# Patient Record
Sex: Female | Born: 1975 | Hispanic: Yes | Smoking: Never smoker
Health system: Southern US, Community
[De-identification: ages and names within clinical notes are randomized; demographics above are authoritative.]

## PROBLEM LIST (undated history)

## (undated) DIAGNOSIS — I10 Essential (primary) hypertension: Secondary | ICD-10-CM

---

## 2020-05-28 ENCOUNTER — Emergency Department
Admission: EM | Admit: 2020-05-28 | Discharge: 2020-05-28 | Disposition: A | Discharge status: Home / Self Care | Payer: 59 | Attending: Emergency Medicine | Admitting: Emergency Medicine

## 2020-05-28 ENCOUNTER — Emergency Department: Payer: 59

## 2020-05-28 ENCOUNTER — Other Ambulatory Visit: Payer: Self-pay

## 2020-05-28 DIAGNOSIS — R110 Nausea: Secondary | ICD-10-CM | POA: Insufficient documentation

## 2020-05-28 DIAGNOSIS — R109 Unspecified abdominal pain: Secondary | ICD-10-CM | POA: Insufficient documentation

## 2020-05-28 DIAGNOSIS — R630 Anorexia: Secondary | ICD-10-CM | POA: Insufficient documentation

## 2020-05-28 DIAGNOSIS — M549 Dorsalgia, unspecified: Secondary | ICD-10-CM | POA: Insufficient documentation

## 2020-05-28 DIAGNOSIS — I10 Essential (primary) hypertension: Secondary | ICD-10-CM | POA: Insufficient documentation

## 2020-05-28 HISTORY — DX: Essential (primary) hypertension: I10

## 2020-05-28 LAB — BASIC METABOLIC PANEL
Anion gap: 9 (ref 5–15)
BUN: 13 mg/dL (ref 6–20)
CO2: 25 mmol/L (ref 22–32)
Calcium: 8.7 mg/dL — ABNORMAL LOW (ref 8.9–10.3)
Chloride: 105 mmol/L (ref 98–111)
Creatinine, Ser: 1.23 mg/dL — ABNORMAL HIGH (ref 0.44–1.00)
GFR, Estimated: 56 mL/min — ABNORMAL LOW (ref >60)
Glucose, Bld: 113 mg/dL — ABNORMAL HIGH (ref 70–99)
Potassium: 3.7 mmol/L (ref 3.5–5.1)
Sodium: 139 mmol/L (ref 135–145)

## 2020-05-28 LAB — CBC
HCT: 33.1 % — ABNORMAL LOW (ref 36.0–46.0)
Hemoglobin: 9.7 g/dL — ABNORMAL LOW (ref 12.0–15.0)
MCH: 21.2 pg — ABNORMAL LOW (ref 26.0–34.0)
MCHC: 29.3 g/dL — ABNORMAL LOW (ref 30.0–36.0)
MCV: 72.3 fL — ABNORMAL LOW (ref 80.0–100.0)
Platelets: 576 10*3/uL — ABNORMAL HIGH (ref 150–400)
RBC: 4.58 MIL/uL (ref 3.87–5.11)
RDW: 18 % — ABNORMAL HIGH (ref 11.5–15.5)
WBC: 7.4 10*3/uL (ref 4.0–10.5)
nRBC: 0 % (ref 0.0–0.2)

## 2020-05-28 LAB — HEPATIC FUNCTION PANEL
ALT: 15 U/L (ref 0–44)
AST: 15 U/L (ref 15–41)
Albumin: 3.9 g/dL (ref 3.5–5.0)
Alkaline Phosphatase: 63 U/L (ref 38–126)
Bilirubin, Direct: <0.1 mg/dL (ref 0.0–0.2)
Total Bilirubin: 0.5 mg/dL (ref 0.3–1.2)
Total Protein: 7.5 g/dL (ref 6.5–8.1)

## 2020-05-28 LAB — URINALYSIS, ROUTINE W REFLEX MICROSCOPIC
Bacteria, UA: NONE SEEN
Bilirubin Urine: NEGATIVE
Glucose, UA: NEGATIVE mg/dL
Ketones, ur: NEGATIVE mg/dL
Leukocytes,Ua: NEGATIVE
Nitrite: NEGATIVE
Protein, ur: NEGATIVE mg/dL
Specific Gravity, Urine: 1.013 (ref 1.005–1.030)
pH: 6 (ref 5.0–8.0)

## 2020-05-28 LAB — LIPASE, BLOOD: Lipase: 43 U/L (ref 11–51)

## 2020-05-28 LAB — PREGNANCY, URINE: Preg Test, Ur: NEGATIVE

## 2020-05-28 MED ORDER — LACTATED RINGERS IV BOLUS
1000.0000 mL | Freq: Once | INTRAVENOUS | Status: AC
  Administered 2020-05-28: 1000 mL via INTRAVENOUS

## 2020-05-28 MED ORDER — OXYCODONE-ACETAMINOPHEN 5-325 MG PO TABS: 2.0000 | ORAL_TABLET | Freq: Four times a day (QID) | ORAL | 0 refills | Status: DC | PRN

## 2020-05-28 MED ORDER — HYDROCODONE-ACETAMINOPHEN 5-325 MG PO TABS
2.0000 | ORAL_TABLET | Freq: Once | ORAL | Status: AC
Start: 2020-05-28 — End: 2020-05-28
  Administered 2020-05-28: 2 via ORAL
  Filled 2020-05-28: qty 2

## 2020-05-28 NOTE — Discharge Instructions (Addendum)
Your workup in the Emergency Department today was reassuring.  We did not find any specific abnormalities.  We recommend you drink plenty of fluids, take your regular medications and/or any new ones prescribed today, and follow up with the doctor(s) listed in these documents as recommended.  Return to the Emergency Department if you develop new or worsening symptoms that concern you.  

## 2020-05-28 NOTE — ED Triage Notes (Addendum)
Pt presents to ER c/o BIL flank pain that radiates into groin that started yesterday morning.  Pt states she has hx of kidney stones.  Pt denies urinary symptoms.  Pt reports nausea, but denied vomiting or diarrhea.

## 2020-05-28 NOTE — ED Provider Notes (Signed)
[facility] Emergency Department Provider Note  ____________________________________________   Event Date/Time   First MD Initiated Contact with Patient 05/28/20 [phone]     (approximate)  I have reviewed the triage vital signs and the nursing notes.   HISTORY  Chief Complaint Back Pain    HPI Christina Beard is a 45 y.o. female with medical history as listed below who presents for evaluation of about 24 hours of sharp and aching pain in both sides of her back that radiates around to the front of her abdomen.  She noticed that when she woke up yesterday morning and it is persistent.  Nothing particular makes it better or worse.  She has had nausea and decreased appetite but no vomiting and no diarrhea.  No pain in her pelvis.  She is currently on her menstrual cycle.  She has no pain when she urinates and no increased urinary frequency.  She denies  fever, sore throat, chest pain, shortness of breath.  She has no known history of gallbladder issues and at first she says she has a history of kidney stones but then she said she is not sure.          Past Medical History:  Diagnosis Date  . Hypertension     There are no problems to display for this patient.   History reviewed. No pertinent surgical history.  Prior to Admission medications   Medication Sig Start Date End Date Taking? Authorizing Provider  oxyCODONE-acetaminophen (PERCOCET) 5-325 MG tablet Take 2 tablets by mouth every 6 (six) hours as needed for severe pain. 05/28/20  Yes Loleta Rose, MD  ibuprofen (ADVIL) 800 MG tablet Take by mouth.    [provider]  meclizine (ANTIVERT) 25 MG tablet Take by mouth.    [provider]    Allergies Patient has no known allergies.  History reviewed. No pertinent family history.  Social History Social History   Tobacco Use  . Smoking status: Never Smoker  . Smokeless tobacco: Never Used  Substance Use Topics  . Alcohol  use: Yes    Comment: occasional  . Drug use: Never    Review of Systems Constitutional: No fever/chills Eyes: No visual changes. ENT: No sore throat. Cardiovascular: Denies chest pain. Respiratory: Denies shortness of breath. Gastrointestinal: Pain radiating either from or to both sides of her back/flank to the front of her abdomen.  Nausea, no vomiting, no diarrhea. Genitourinary: Negative for dysuria. Musculoskeletal: Bilateral flank pain. Integumentary: Negative for rash. Neurological: Negative for headaches, focal weakness or numbness.   ____________________________________________   PHYSICAL EXAM:  VITAL SIGNS: ED Triage Vitals  Enc Vitals Group     BP 05/28/20 0146 (!) 141/84     Pulse Rate 05/28/20 0146 77     Resp 05/28/20 0146 18     Temp 05/28/20 0146 97.7 F (36.5 C)     Temp Source 05/28/20 0146 Oral     SpO2 05/28/20 0146 99 %     Weight 05/28/20 0149 68.9 kg (152 lb)     Height 05/28/20 0149 1.626 m (5\' 4" )     Head Circumference --      Peak Flow --      Pain Score 05/28/20 0149 9     Pain Loc --      Pain Edu? --      Excl. in GC? --     Constitutional: Alert and oriented.  Eyes: Conjunctivae are normal.  Head: Atraumatic. Nose: No congestion/rhinnorhea.  Mouth/Throat: Patient is wearing a mask. Neck: No stridor.  No meningeal signs.   Cardiovascular: Normal rate, regular rhythm. Good peripheral circulation. Respiratory: Normal respiratory effort.  No retractions. Gastrointestinal: Soft and nondistended.  Tenderness to palpation of the epigastrium with increased tenderness to the right upper quadrant with an equivocal Murphy sign.  No lower abdominal tenderness to palpation. Musculoskeletal: Tenderness to percussion on bilateral flanks.  No lower extremity tenderness nor edema. No gross deformities of extremities. Neurologic:  Normal speech and language. No gross focal neurologic deficits are appreciated.  Skin:  Skin is warm, dry and  intact. Psychiatric: Mood and affect are normal. Speech and behavior are normal.  ____________________________________________   LABS (all labs ordered are listed, but only abnormal results are displayed)  Labs Reviewed  CBC - Abnormal; Notable for the following components:      Result Value   Hemoglobin 9.7 (*)    HCT 33.1 (*)    MCV 72.3 (*)    MCH 21.2 (*)    MCHC 29.3 (*)    RDW 18.0 (*)    Platelets 576 (*)    All other components within normal limits  BASIC METABOLIC PANEL - Abnormal; Notable for the following components:   Glucose, Bld 113 (*)    Creatinine, Ser 1.23 (*)    Calcium 8.7 (*)    GFR, Estimated 56 (*)    All other components within normal limits  URINALYSIS, ROUTINE W REFLEX MICROSCOPIC - Abnormal; Notable for the following components:   Color, Urine YELLOW (*)    APPearance CLEAR (*)    Hgb urine dipstick MODERATE (*)    All other components within normal limits  PREGNANCY, URINE  HEPATIC FUNCTION PANEL  LIPASE, BLOOD  POC URINE PREG, ED   ____________________________________________  EKG  None - EKG not ordered by ED physician ____________________________________________  RADIOLOGY Marylou MccoyI, Cory Forbach, personally viewed and evaluated these images (plain radiographs) as part of my medical decision making, as well as reviewing the written report by the radiologist.  ED MD interpretation: No acute abnormality identified on US renal nor US RUQ.  Official radiology report(s): US Renal  Result Date: 05/28/2020 CLINICAL DATA:  Bilateral flank pain, evaluate for hydronephrosis and urolithiasis EXAM: RENAL / URINARY TRACT ULTRASOUND COMPLETE COMPARISON:  Contemporary right upper quadrant ultrasound FINDINGS: Right Kidney: Renal measurements: 10.2 x 4.8 x 4.9 cm = volume: 124 mL. Echogenicity is within normal limits. No concerning renal mass, shadowing calculus or hydronephrosis. Left Kidney: Renal measurements: 9.9 x 5.0 x 5.4 cm = volume: 138 mL.  Echogenicity is within normal limits. No concerning renal mass, shadowing calculus or hydronephrosis. Bladder: Appears normal for degree of bladder distention. Other: None. IMPRESSION: Unremarkable urinary tract ultrasound. Electronically Signed   By: Kreg ShropshirePrice  DeHay M.D.   On: 05/28/2020 05:11   US ABDOMEN LIMITED RUQ (LIVER/GB)  Result Date: 05/28/2020 CLINICAL DATA:  Bilateral flank pain EXAM: ULTRASOUND ABDOMEN LIMITED RIGHT UPPER QUADRANT COMPARISON:  None. FINDINGS: Gallbladder: Gallbladder is partially contracted at the time of examination. Wall thickness of 1.8 mm is remains within normal limits. No visible calcified gallstones or biliary sludge. No pericholecystic fluid. Sonographic Eulah PontMurphy sign is reportedly negative. Common bile duct: Diameter: 2.5 mm, nondilated Liver: Ill-defined echogenic focus seen in the left lobe liver (11/28) measuring approximately 2.1 cm in size. No other focal liver lesion. Normal hepatic echogenicity. No intrahepatic biliary ductal dilatation. Smooth liver surface contour. Portal vein is patent on color Doppler imaging with normal direction of blood flow  towards the liver. Other: None. IMPRESSION: 1. Gallbladder is partially contracted at the time of examination but otherwise appears within normal limits. 2. Ill-defined echogenic focus in the left lobe liver measuring 2.1 cm. This is nonspecific and could reflect a hemangioma or focal fatty infiltration in the absence of hepatic risk factors. If further evaluation is clinically warranted, contrast-enhanced MR imaging could be obtained on a nonemergent basis. Electronically Signed   By: Kreg Shropshire M.D.   On: 05/28/2020 05:13    ____________________________________________   PROCEDURES   Procedure(s) performed (including Critical Care):  Procedures   ____________________________________________   INITIAL IMPRESSION / MDM / ASSESSMENT AND PLAN / ED COURSE  As part of my medical decision making, I reviewed the  following data within the electronic MEDICAL RECORD NUMBER Nursing notes reviewed and incorporated, Labs reviewed , Old chart reviewed, Notes from prior ED visits and [place] Controlled Substance Database   Differential diagnosis includes, but is not limited to, biliary colic, UTI/pyelonephritis, renal/ureteral stone, ovarian cysts, STD/PID.  The patient's symptoms were acute in onset and have been persistent for nearly 24 hours.  The nature of the systems seems more consistent with biliary colic than renal colic, although the distribution of pain is not completely consistent with either.  However on exam she was quite tender in the epigastrium and right upper quadrant as well as to percussion on her flanks although the pain seems to be a little bit more inferior than one would typically expect from CVA tenderness.  I added on hepatic function test and lipase to her existing lab work.  Her basic metabolic panel is notable for a slight elevation of her creatinine to 1.2 but generally reassuring.  CBC is normal other than some mild anemia.  Urinalysis is only notable for hemoglobin but the patient is on her menstrual cycle.  Urine pregnancy test is negative.  We will evaluate with bilateral renal ultrasound to look for evidence of hydronephrosis suggestive of kidney stone, as well as a right upper quadrant ultrasound to evaluate her gallbladder.  The patient declines any pain medicine.  She said that she got something earlier but I do not see any record of it.  Regardless she does not need analgesia or antiemetics at this time.       Clinical Course as of 05/28/20 4098  Mon May 28, 2020  1191 Patient says she feels better but still feels some pain in her back and sides.  Her ultrasounds were unremarkable with no evidence of obstructive uropathy/hydronephrosis and no right upper quadrant abnormalities to suggest biliary colic.  We talked about the possibility of musculoskeletal strain.  Medications as listed  below, conservative management as an outpatient.  I gave my usual and customary return precautions and she agrees with the plan. [CF]    Clinical Course User Index [CF] Loleta Rose, MD     ____________________________________________  FINAL CLINICAL IMPRESSION(S) / ED DIAGNOSES  Final diagnoses:  Bilateral flank pain     MEDICATIONS GIVEN DURING THIS VISIT:  Medications  lactated ringers bolus 1,000 mL (0 mLs Intravenous Stopped 05/28/20 0606)  HYDROcodone-acetaminophen (NORCO/VICODIN) 5-325 MG per tablet 2 tablet (2 tablets Oral Given 05/28/20 0636)     ED Discharge Orders         Ordered    oxyCODONE-acetaminophen (PERCOCET) 5-325 MG tablet  Every 6 hours PRN        05/28/20 0654          *Please note:  Dorris Fetch was  evaluated in Emergency Department on 05/28/2020 for the symptoms described in the history of present illness. She was evaluated in the context of the global COVID-19 pandemic, which necessitated consideration that the patient might be at risk for infection with the SARS-CoV-2 virus that causes COVID-19. Institutional protocols and algorithms that pertain to the evaluation of patients at risk for COVID-19 are in a state of rapid change based on information released by regulatory bodies including the CDC and federal and state organizations. These policies and algorithms were followed during the patient's care in the ED.  Some ED evaluations and interventions may be delayed as a result of limited staffing during and after the pandemic.*  Note:  This document was prepared using Dragon voice recognition software and may include unintentional dictation errors.   Loleta Rose, MD 05/28/20 [phone]

## 2020-06-01 ENCOUNTER — Ambulatory Visit
Admission: RE | Admit: 2020-06-01 | Discharge: 2020-06-01 | Disposition: A | Discharge status: Home / Self Care | Payer: 59 | Source: Ambulatory Visit | Attending: Primary Care | Admitting: Primary Care

## 2020-06-01 ENCOUNTER — Other Ambulatory Visit: Payer: Self-pay | Admitting: Primary Care

## 2020-06-01 DIAGNOSIS — R52 Pain, unspecified: Secondary | ICD-10-CM | POA: Insufficient documentation

## 2021-07-30 IMAGING — CR DG THORACIC SPINE 2V
3 series · 3 of 3 positions shown · non-contrast
Comparison: None.

CLINICAL DATA: Back pain.

EXAM:
THORACIC SPINE 2 VIEWS

[t-spine ap]
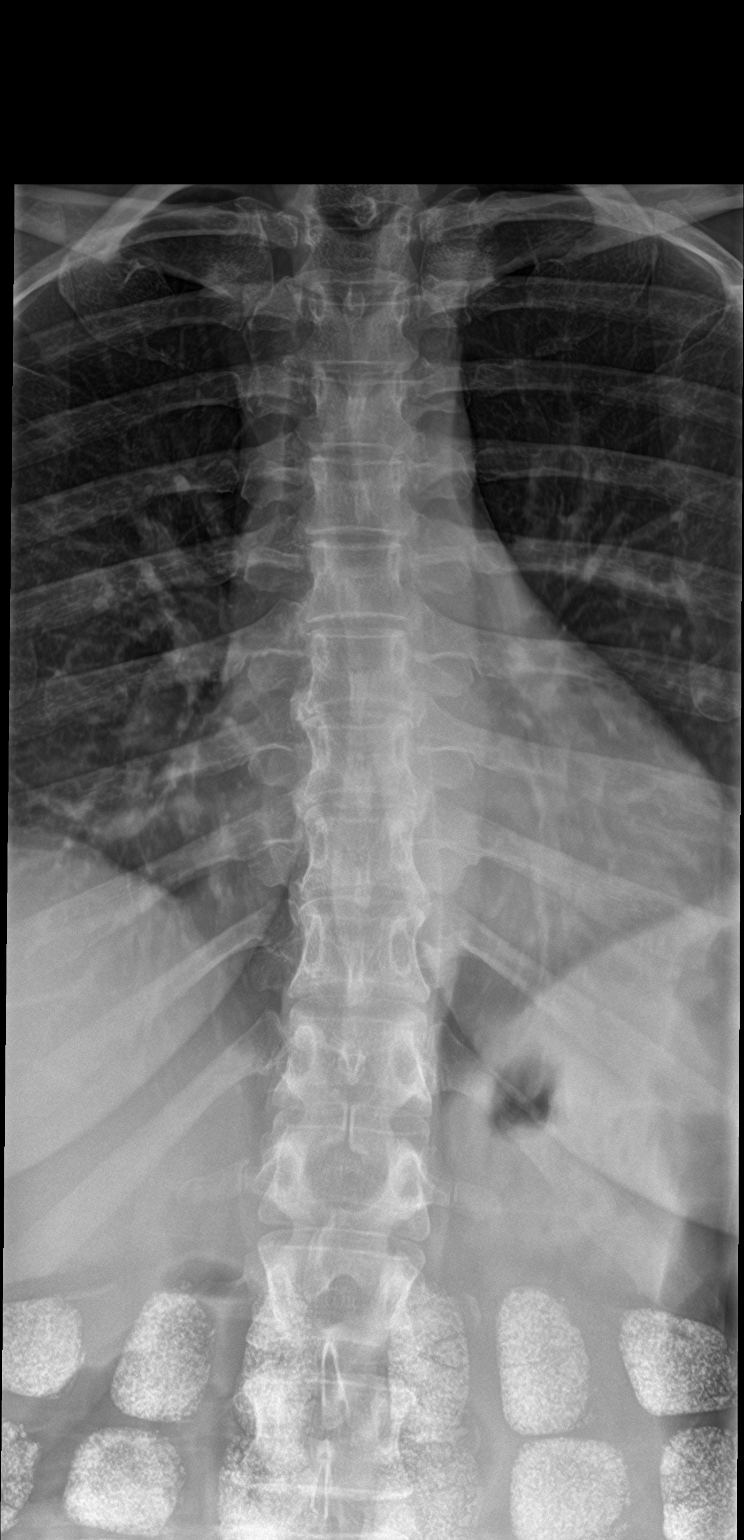

[t-spine lat]
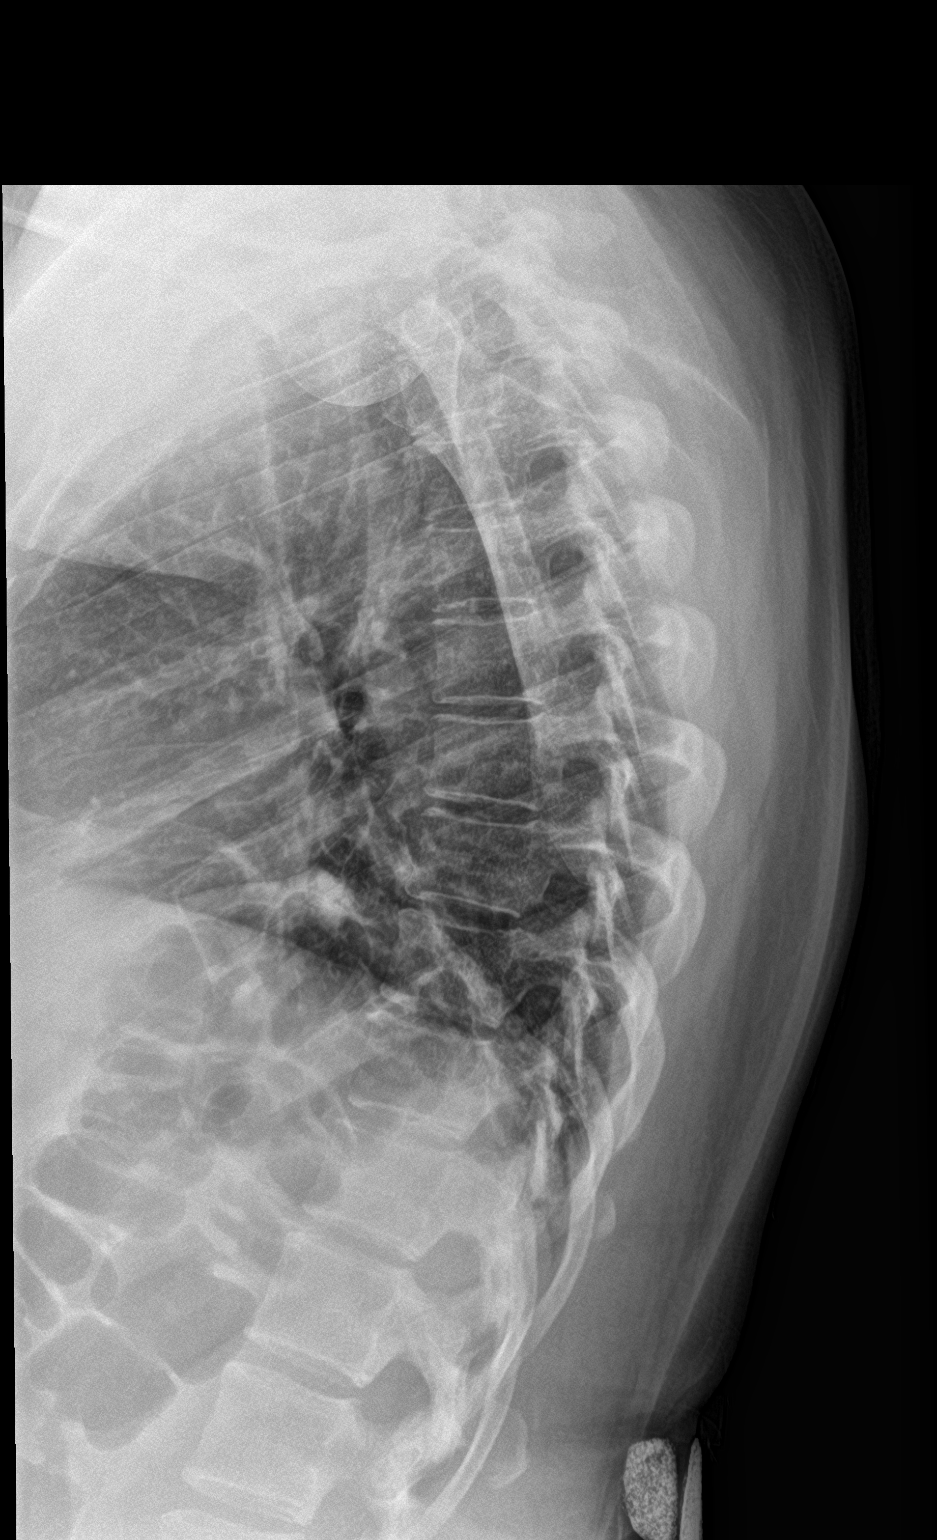

[t-spine swimmers]
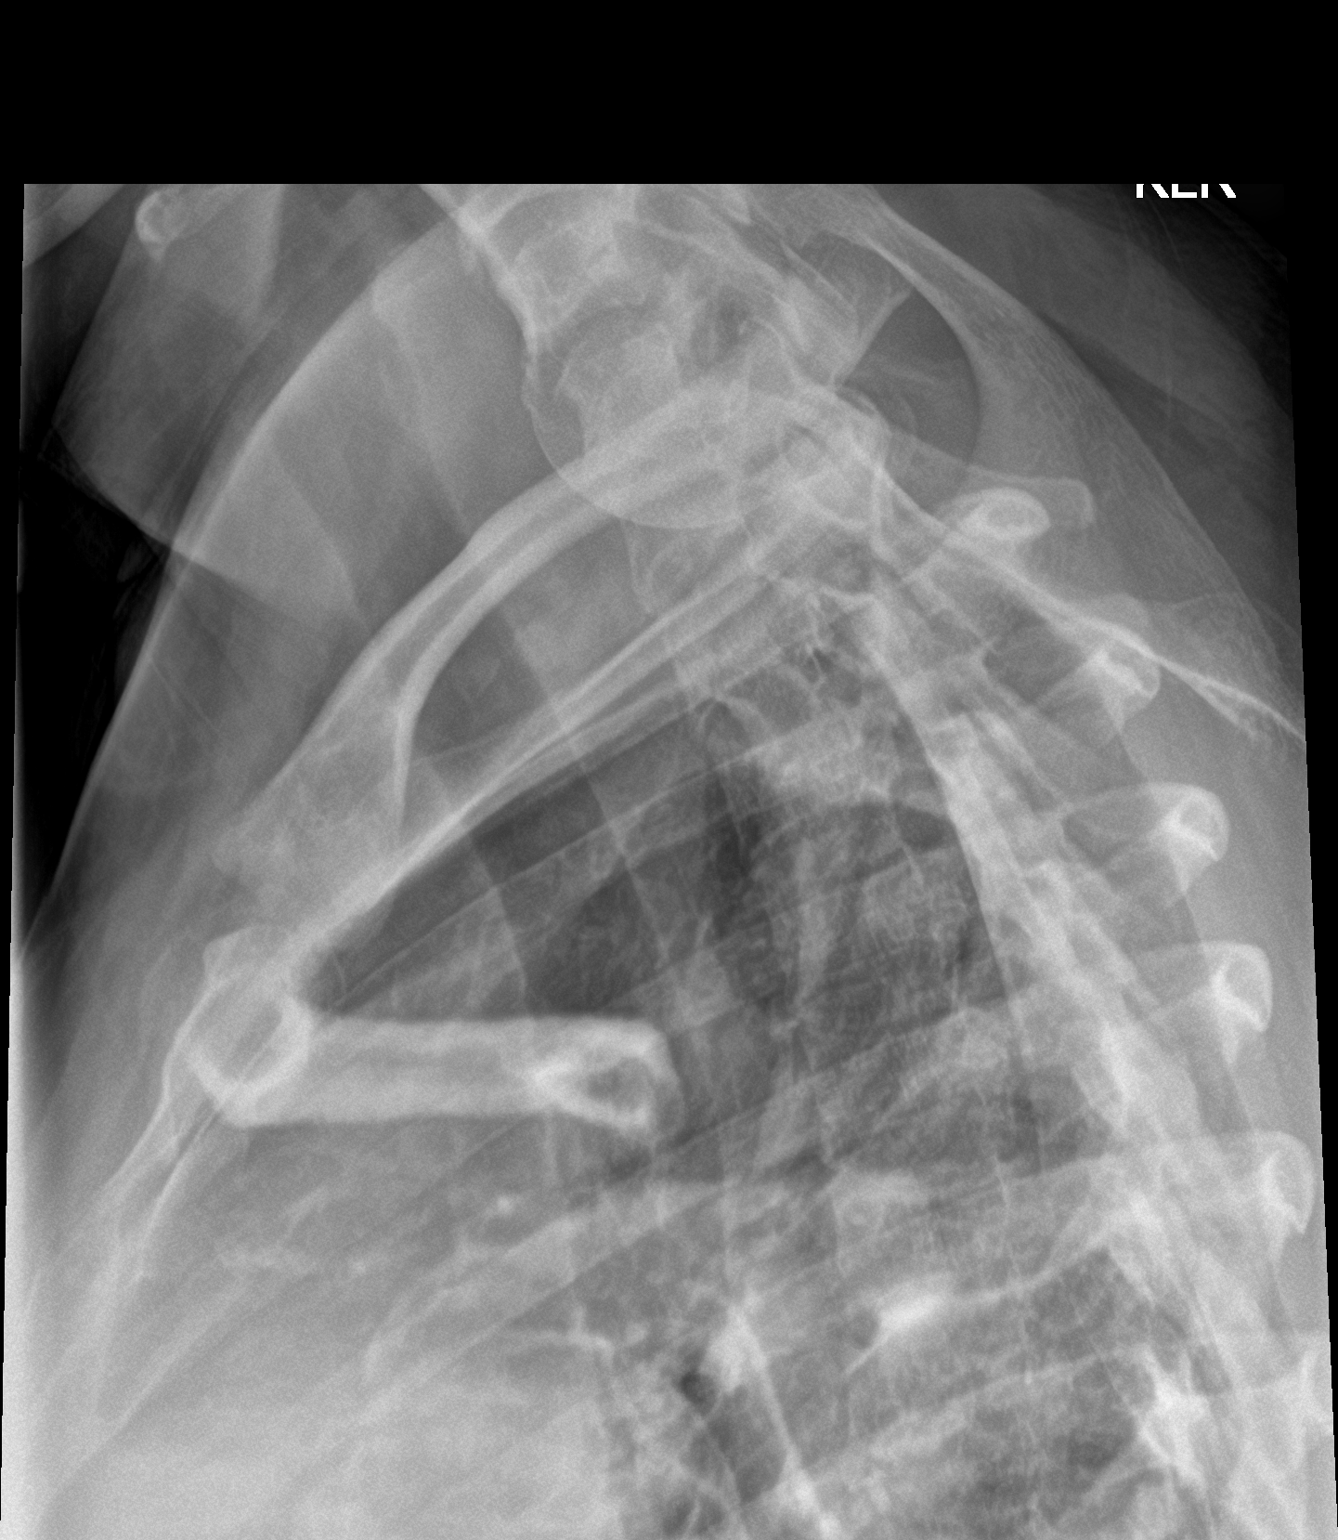

[3 of 3 positions shown; findings below may reference images not displayed]

FINDINGS: There is no evidence of thoracic spine fracture. Alignment is
normal. No other significant bone abnormalities are identified. High
density cutaneous patch projecting over the lumbar spine.
IMPRESSION: No acute osseous abnormality.

## 2023-12-28 NOTE — Progress Notes (Unsigned)
  Cardiology Office Note   Date:  12/29/2023  ID:  Christina Beard, DOB 08-04-1975, MRN [id number] PCP: Ernie Yancy Roof, MD  [facility] Providers Cardiologist:  Caron Poser, MD     History of Present Illness Christina Beard is a 48 y.o. female no relevant PMH who presents for further evaluation and management of palpitations.  Patient recently seen in the ED for this issue 11/17/2023.  Serial troponin negative.  CBC showed anemia with hemoglobin 10.6 (chronic).  CMP unremarkable.  Patient reports a several month history of intermittent palpitations.  She says they are sometimes provoked with exertion, sometimes at rest.  They sometimes occur after waking.  Episodes sometimes last for a while, other times are short-lived.  She denies any obvious discernible pattern.  She denies any syncope.  She denies any chest discomfort or other obvious cardiovascular symptoms.  Relevant CVD History -None   ROS: Pt denies any chest discomfort, jaw pain, arm pain, syncope, presyncope, orthopnea, PND, or LE edema.  Studies Reviewed I have independently reviewed the patient's ECG, recent medical records, recent blood work.  Physical Exam VS:  BP 114/76 (BP Location: Left Arm, Patient Position: Sitting, Cuff Size: Normal)   Pulse 64   Ht 5' 4 (1.626 m)   Wt 162 lb (73.5 kg)   SpO2 99%   BMI 27.81 kg/m        Wt Readings from Last 3 Encounters:  12/29/23 162 lb (73.5 kg)  05/28/20 152 lb (68.9 kg)    GEN: No acute distress. NECK: No JVD; No carotid bruits. CARDIAC: RRR, no murmurs, rubs, gallops. RESPIRATORY:  Clear to auscultation. EXTREMITIES:  Warm and well-perfused. No edema.  ASSESSMENT AND PLAN Paroxysmal tachycardia Palpitations Patient presents with undifferentiated palpitations and tachycardia.  Recent ED visit showed stable chronic anemia with hemoglobin 10.6, but no other obvious inciting factors.  She reports that her PCP recently checked a thyroid  panel which was abnormal; the current results and treatment plan are not available in Care Everywhere.  Regardless, we will also pursue a cardiac evaluation.  Plan: - Advised patient to follow-up with PCP regarding abnormal thyroid testing and treatment plan; this could certainly be driving her symptoms - Monitor to evaluate for arrhythmogenic etiology - Echocardiogram to evaluate for structural etiology - Since symptoms are currently tolerable, we will hold off on any empiric treatment until we have more information        Dispo: RTC 3 months or sooner as needed  Signed, Caron Poser, MD

## 2023-12-29 ENCOUNTER — Ambulatory Visit: Payer: Self-pay

## 2023-12-29 ENCOUNTER — Ambulatory Visit

## 2023-12-29 VITALS — BP 114/76 | HR 64 | Ht 64.0 in | Wt 162.0 lb

## 2023-12-29 DIAGNOSIS — R002 Palpitations: Secondary | ICD-10-CM

## 2023-12-29 DIAGNOSIS — I479 Paroxysmal tachycardia, unspecified: Secondary | ICD-10-CM

## 2023-12-29 NOTE — Patient Instructions (Signed)
 Medication Instructions:  Your physician recommends that you continue on your current medications as directed. Please refer to the Current Medication list given to you today.  *If you need a refill on your cardiac medications before your next appointment, please call your pharmacy*  Lab Work: No labs ordered today  If you have labs (blood work) drawn today and your tests are completely normal, you will receive your results only by: MyChart Message (if you have MyChart) OR A paper copy in the mail If you have any lab test that is abnormal or we need to change your treatment, we will call you to review the results.  Testing/Procedures: Your physician has requested that you have an echocardiogram. Echocardiography is a painless test that uses sound waves to create images of your heart. It provides your doctor with information about the size and shape of your heart and how well your heart's chambers and valves are working.   You may receive an ultrasound enhancing agent through an IV if needed to better visualize your heart during the echo. This procedure takes approximately one hour.  There are no restrictions for this procedure.  This will take place at [address] [facility] [address], [place] [address]  Please note: We ask at that you not bring children with you during ultrasound (echo/ vascular) testing. Due to room size and safety concerns, children are not allowed in the ultrasound rooms during exams. Our front office staff cannot provide observation of children in our lobby area while testing is being conducted. An adult accompanying a patient to their appointment will only be allowed in the ultrasound room at the discretion of the ultrasound technician under special circumstances. We apologize for any inconvenience.     ZIO XT- Long Term Monitor Instructions  Your physician has requested you wear a ZIO patch monitor for 14 days.  This is a single patch monitor. [employer]  supplies one patch monitor per enrollment. Additional stickers are not available. Please do not apply patch if you will be having a Nuclear Stress Test, Echocardiogram, Cardiac CT, MRI, or Chest Xray during the period you would be wearing the monitor. The patch cannot be worn during these tests. You cannot remove and re-apply the ZIO XT patch monitor.  Your ZIO patch monitor will be mailed 3 day USPS to your address on file. It may take 3-5 days to receive your monitor after you have been enrolled. Once you have received your monitor, please review the enclosed instructions. Your monitor has already been registered assigning a specific monitor serial number to you.  Billing and Patient Assistance Program Information  We have supplied [employer] with any of your insurance information on file for billing purposes.  [employer] offers a sliding scale Patient Assistance Program for patients that do not have insurance, or whose insurance does not completely cover the cost of the ZIO monitor.  You must apply for the Patient Assistance Program to qualify for this discounted rate.  To apply, please call [employer] at [phone], select option 4, select option 2, ask to apply for Patient Assistance Program. Meredeth will ask your household income, and how many people are in your household. They will quote your out-of-pocket cost based on that information. [employer] will also be able to set up a 62-month, interest-free payment plan if needed.  Applying the monitor   Shave hair from upper left chest.  Hold abrader disc by orange tab. Rub abrader in 40 strokes over the upper left chest as  indicated in your monitor instructions.  Clean area with 4 enclosed alcohol pads. Let dry.  Apply patch as indicated in monitor instructions. Patch will be placed under collarbone on left side of chest with arrow pointing upward.  Rub patch adhesive wings for 2 minutes. Remove white label marked 1. Remove the white label marked 2. Rub  patch adhesive wings for 2 additional minutes.  While looking in a mirror, press and release button in center of patch. A small green light will flash 3-4 times. This will be your only indicator that the monitor has been turned on.   After Applying Monitor: Do not shower for the first 24 hours. You may shower after the first 24 hours.  Press the button if you feel a symptom. You will hear a small click. Record Date, Time and Symptom in the Patient Logbook.   After Completing 14 Days: When you are ready to remove the patch, follow instructions on the last 2 pages of Patient Logbook.  Stick patch monitor into the tabs at the bottom of the return box.  Place Patient Logbook in the blue and white box. Use locking tab on box and tape box closed securely. The blue and white box has prepaid postage on it. Please place it in the mailbox as soon as possible. Your physician should have your test results approximately 7-14 days after the monitor has been mailed back to [facility].   Troubleshooting: Call [facility] at [phone] if you have questions regarding your ZIO XT patch monitor.  Call them immediately if you see an orange light blinking on your monitor.  If your monitor falls off in less than 4 days, contact our Monitor department at [phone].  If your monitor becomes loose or falls off after 4 days call [employer] at [phone] for suggestions on securing your monitor.   Follow-Up: At [facility], you and your health needs are our priority.  As part of our continuing mission to provide you with exceptional heart care, our providers are all part of one team.  This team includes your primary Cardiologist (physician) and Advanced Practice Providers or APPs (Physician Assistants and Nurse Practitioners) who all work together to provide you with the care you need, when you need it.  Your next appointment:   3 month(s)  Provider:   You may see Caron Poser,  MD or one of the following Advanced Practice Providers on your designated Care Team:   Lonni Meager, NP Lesley Maffucci, PA-C Bernardino Bring, PA-C Cadence [place], PA-C Tylene Lunch, NP Barnie Hila, NP    We recommend signing up for the patient portal called MyChart.  Sign up information is provided on this After Visit Summary.  MyChart is used to connect with patients for Virtual Visits (Telemedicine).  Patients are able to view lab/test results, encounter notes, upcoming appointments, etc.  Non-urgent messages can be sent to your provider as well.   To learn more about what you can do with MyChart, go to [email or web address].

## 2024-01-23 ENCOUNTER — Ambulatory Visit: Payer: Self-pay

## 2024-01-23 DIAGNOSIS — I479 Paroxysmal tachycardia, unspecified: Secondary | ICD-10-CM | POA: Diagnosis not present

## 2024-01-23 DIAGNOSIS — R002 Palpitations: Secondary | ICD-10-CM | POA: Diagnosis not present

## 2024-02-08 ENCOUNTER — Other Ambulatory Visit: Payer: Self-pay

## 2024-02-08 DIAGNOSIS — R002 Palpitations: Secondary | ICD-10-CM

## 2024-02-08 DIAGNOSIS — I479 Paroxysmal tachycardia, unspecified: Secondary | ICD-10-CM

## 2024-02-11 ENCOUNTER — Ambulatory Visit

## 2024-02-11 DIAGNOSIS — R002 Palpitations: Secondary | ICD-10-CM

## 2024-02-11 DIAGNOSIS — I479 Paroxysmal tachycardia, unspecified: Secondary | ICD-10-CM

## 2024-02-11 LAB — ECHOCARDIOGRAM COMPLETE
AR max vel: 1.83 cm2
AV Area VTI: 1.95 cm2
AV Area mean vel: 1.93 cm2
AV Mean grad: 3 mmHg
AV Peak grad: 5.7 mmHg
Ao pk vel: 1.19 m/s
Area-P 1/2: 4.39 cm2
S' Lateral: 2.9 cm

## 2024-03-30 NOTE — Progress Notes (Unsigned)
" °  Cardiology Office Note   Date:  03/31/2024  ID:  Christina Beard, DOB [birth year], MRN [id number] PCP: Ernie Yancy Roof, MD  [facility] Providers Cardiologist:  Caron Poser, MD     History of Present Illness Christina Beard is a 49 y.o. female no relevant PMH who presents for further evaluation and management of palpitations.  Patient recently seen in the ED for this issue 11/17/2023.  Serial troponin negative.  CBC showed anemia with hemoglobin 10.6 (chronic).  CMP unremarkable.  Patient reports a several month history of intermittent palpitations.  She says they are sometimes provoked with exertion, sometimes at rest.  They sometimes occur after waking.  Episodes sometimes last for a while, other times are short-lived.  She denies any obvious discernible pattern.  She denies any syncope.  She denies any chest discomfort or other obvious cardiovascular symptoms.  Interval history: Since last visit, we obtained a monitor and echocardiogram.  The monitor showed some triggered correlation with PACs but no significant arrhythmias.  Echo was unremarkable.  She notes that she is still having palpitations.  PCP is working with her on anemia and thyroid  Relevant CVD History - TTE 02/2024 normal biventricular function, no significant valvular disease - Monitor 01/2024 mean heart rate 91 bpm, rare ectopy, no arrhythmias.  Triggers corresponded to mostly sinus rhythm with occasional PACs.   ROS: Pt denies any chest discomfort, jaw pain, arm pain, syncope, presyncope, orthopnea, PND, or LE edema.  Studies Reviewed I have independently reviewed the patient's ECG, recent medical records, recent blood work.  Physical Exam VS:  BP 130/88 (BP Location: Left Arm, Patient Position: Sitting)   Pulse 81   Ht 5' 4 (1.626 m)   Wt 167 lb 6.4 oz (75.9 kg)   SpO2 97%   BMI 28.73 kg/m        Wt Readings from Last 3 Encounters:  03/31/24 167 lb 6.4 oz (75.9 kg)  12/29/23 162 lb  (73.5 kg)  05/28/20 152 lb (68.9 kg)    GEN: No acute distress. NECK: No JVD; No carotid bruits. CARDIAC: RRR, no murmurs, rubs, gallops. RESPIRATORY:  Clear to auscultation. EXTREMITIES:  Warm and well-perfused. No edema.  ASSESSMENT AND PLAN Paroxysmal tachycardia Palpitations PACs Patient initially presented with palpitations and tachycardia.  Previous ED visit showed stable chronic anemia with hemoglobin 10.6, but no other obvious inciting factors.  She reports that her PCP recently checked a thyroid panel which was abnormal; the current results and treatment plan are not available in Care Everywhere.  We obtained a echocardiogram which was normal and a monitor which showed occasional PACs that corresponded to triggers.  Plan: - Advised patient to follow-up with PCP regarding abnormal thyroid testing and treatment plan; this could certainly be driving her symptoms - Since symptoms are still ongoing without other obvious trigger, we will start metoprolol  XL 25 mg daily; plan to uptitrate to effect        Dispo: RTC 3 months or sooner as needed  Signed, Caron Poser, MD  "

## 2024-03-31 ENCOUNTER — Ambulatory Visit: Payer: Self-pay

## 2024-03-31 VITALS — BP 130/88 | HR 81 | Ht 64.0 in | Wt 167.4 lb

## 2024-03-31 DIAGNOSIS — R002 Palpitations: Secondary | ICD-10-CM | POA: Diagnosis not present

## 2024-03-31 DIAGNOSIS — I491 Atrial premature depolarization: Secondary | ICD-10-CM

## 2024-03-31 DIAGNOSIS — I479 Paroxysmal tachycardia, unspecified: Secondary | ICD-10-CM

## 2024-03-31 MED ORDER — METOPROLOL SUCCINATE ER 25 MG PO TB24: 25.0000 mg | ORAL_TABLET | Freq: Every day | ORAL | 3 refills | Status: AC

## 2024-03-31 NOTE — Patient Instructions (Signed)
 Medication Instructions:  - START metoprolol  XL 25 mg daily   *If you need a refill on your cardiac medications before your next appointment, please call your pharmacy*  Lab Work: No labs ordered today  If you have labs (blood work) drawn today and your tests are completely normal, you will receive your results only by: MyChart Message (if you have MyChart) OR A paper copy in the mail If you have any lab test that is abnormal or we need to change your treatment, we will call you to review the results.  Testing/Procedures: No test ordered today   Follow-Up: At [facility], you and your health needs are our priority.  As part of our continuing mission to provide you with exceptional heart care, our providers are all part of one team.  This team includes your primary Cardiologist (physician) and Advanced Practice Providers or APPs (Physician Assistants and Nurse Practitioners) who all work together to provide you with the care you need, when you need it.  Your next appointment:   3 month(s)  Provider:   You may see Caron Poser, MD or one of the following Advanced Practice Providers on your designated Care Team:   Lonni Meager, NP Lesley Maffucci, PA-C Bernardino Bring, PA-C Cadence [place], PA-C Tylene Lunch, NP Barnie Hila, NP    We recommend signing up for the patient portal called MyChart.  Sign up information is provided on this After Visit Summary.  MyChart is used to connect with patients for Virtual Visits (Telemedicine).  Patients are able to view lab/test results, encounter notes, upcoming appointments, etc.  Non-urgent messages can be sent to your provider as well.   To learn more about what you can do with MyChart, go to [email or web address].
